# Patient Record
Sex: Male | Born: 1990 | Race: Black or African American | Hispanic: No | Marital: Single | Smoking: Never smoker
Health system: Southern US, Community
[De-identification: ages and names within clinical notes are randomized; demographics above are authoritative.]

## PROBLEM LIST (undated history)

## (undated) DIAGNOSIS — J40 Bronchitis, not specified as acute or chronic: Secondary | ICD-10-CM

## (undated) HISTORY — PX: OTHER SURGICAL HISTORY: SHX169

---
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
## 2010-07-21 ENCOUNTER — Emergency Department (HOSPITAL_COMMUNITY)
Admission: EM | Admit: 2010-07-21 | Discharge: 2010-07-21 | Payer: Self-pay | Source: Home / Self Care | Admitting: Emergency Medicine

## 2011-01-03 ENCOUNTER — Emergency Department (HOSPITAL_COMMUNITY)
Admission: EM | Admit: 2011-01-03 | Discharge: 2011-01-04 | Disposition: A | Discharge status: Home / Self Care | Payer: Self-pay | Attending: Emergency Medicine | Admitting: Emergency Medicine

## 2011-01-03 ENCOUNTER — Emergency Department (HOSPITAL_COMMUNITY): Payer: Self-pay

## 2011-01-03 DIAGNOSIS — N453 Epididymo-orchitis: Secondary | ICD-10-CM | POA: Insufficient documentation

## 2011-01-03 DIAGNOSIS — N509 Disorder of male genital organs, unspecified: Secondary | ICD-10-CM | POA: Insufficient documentation

## 2011-01-03 LAB — URINALYSIS, ROUTINE W REFLEX MICROSCOPIC
Glucose, UA: NEGATIVE mg/dL
Nitrite: NEGATIVE
Protein, ur: 30 mg/dL — AB
Specific Gravity, Urine: 1.022 (ref 1.005–1.030)
Urobilinogen, UA: 1 mg/dL (ref 0.0–1.0)
pH: 6 (ref 5.0–8.0)

## 2011-01-03 LAB — URINE MICROSCOPIC-ADD ON

## 2011-01-05 LAB — URINE CULTURE
Colony Count: NO GROWTH
Culture  Setup Time: 201204221125
Culture: NO GROWTH

## 2011-01-06 LAB — GC/CHLAMYDIA PROBE AMP, URINE
Chlamydia, Swab/Urine, PCR: POSITIVE — AB
GC Probe Amp, Urine: POSITIVE — AB

## 2011-01-30 NOTE — Op Note (Signed)
NAMESHUAYB, SCHEPERS              ACCOUNT NO.:  [id number]   MEDICAL RECORD NO.:  [id number]          PATIENT TYPE:  INP   LOCATION:  6154                         FACILITY:  [facility]   PHYSICIAN:  Leonia Corona, M.D.  DATE OF BIRTH:  27-Feb-1991   DATE OF PROCEDURE:  06/18/2005  DATE OF DISCHARGE:                                 OPERATIVE REPORT   PREOPERATIVE DIAGNOSES:  1.  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  2.  Need for long term intravenous access.   POSTOPERATIVE DIAGNOSES:  1.  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  2.  Need for long term intravenous access.   OPERATION PERFORMED:  Placement of Hickman catheter.   SURGEON:  Leonia Corona, M.D.   ASSISTANT:  Janetta Hora. Fields, MD   ANESTHESIA:  General endotracheal tube.   INDICATIONS FOR PROCEDURE:  This 20 year old male child was evaluated for  severe abdominal pain and vomiting.  CT scan was suggested for gastric  outlet obstruction secondary to duodenal trauma causing big duodenal  hematoma.  Hence the indication for the procedure.   DESCRIPTION OF PROCEDURE:  The patient was brought to the operating room and  placed supine on the operating table.  General endotracheal tube anesthesia  was given.  The right side of the neck and right chest, shoulder, and right  upper arm was cleaned, prepped and draped in usual manner.  We planned to  place a Hickman catheter 7 [place] double lumen by external jugular vein cut  down.  The patient was given Trendelenburg position to fill the external  jugular vein.  A very superficial incision over the external jugular vein  was made approximately 3 cm above the clavicle.  A 1 cm segment of the  jugular vein was exposed.  Two 4-0 silk sutures were placed just beneath the  selected segment of the external jugular vein.  A counterincision was made  just to the lateral side of the right nipple for the exit of the catheter.  A small incision  was made with knife and a subcutaneous pocket was created  for the placement of the cuff of the catheter.  Blunt tip malleable probe  was passed from this incision and the tip was delivered through the neck  incision.  The Hickman 7 [place] double lumen catheter was fed through the  eye of the probe and pulled through the subcutaneous tunnel and tip was  delivered into the neck incision.  At this point appropriate length under  fluoroscopic control was cut so that the tip of the catheter would lie in  the superior vena cava.  The catheter was cut in oblique fashion.  A  venotomy was made in the exposed part of the jugular vein and the catheter  was spread through this venotomy and advanced under fluoroscopic control.  We were able to follow the tip of the catheter; however, the catheter did  not advance in the direction of the superior vena cava.  Instead it turned  curled upwards towards the neck.  Several attempts were made which were not  successful and at  this point 0.035 size Glide wire used to feed through the  catheter and advance it to guide the catheter in the right direction;  however, this attempt also failed.  Requested vascular surgeon Dr. Darrick Penna  for assistance, who joined me and made and attempt to use a very small Glide  wire with a flexible tip; however, this guidewire also could not be fed  through the catheter to guide it in the right direction toward the superior  vena cava.  Having failed several multiple attempts to guide the tip of the  catheter to the superior vena cava under fluoroscopic control, we decided to  ligate this external jugular vein and make a fresh access in the internal  jugular vein percutaneously.  A long needle attached to a syringe was then  used to access the right internal jugular vein.  Once the access was  obtained, the guidewire was passed through the needle and the position of  the tip of the catheter and guidewire was confirmed under  fluoroscopic  control.  At this point a small incision was made at the site of the needle.  A tunnel was created with the malleable probe from the original neck  incision for external jugular vein towards the site of entry into the  internal jugular vein.  The Hickman catheter was fed through the eye of this  probe and tip was now delivered through the incision at the site of entry  into the internal jugular vein.  The needle was withdrawn keeping the wire  inside the vein.  A peel away catheter with the dilator was introduced over  the guidewire and the tract was dilated and the dilator was removed and the  peel away catheter was held in position and the Hickman catheter was fed  through the peel away catheter confirming the correct position on  fluoroscopy.  The peel away catheter was peeled away and removed.  The tip  of the catheter lay in the innominate vein close to its junction with the  superior vena cava.  The position was confirmed under fluoroscopic control.  The catheter was flushed easily with normal saline and returned venous blood  without any resistance.  At this point the neck incision was closed using 4-  0 Vicryl in subcuticular fashion.  The second incision at the site of  external jugular vein was also closed using 4-0 Vicryl in subcuticular  fashion.  These incisions were cleaned and dried and Steri-Strip dressing  was applied.  The exit site incision on the chest wall was closed using 4-0  Tycron which was tied around  the catheter to prevent external pullout of  the catheter.  The catheter was once again flushed with normal saline and  returned blood easily and subsequently it was hep-locked using 1.2 mL of 100  units per mL heparinized saline solution.  The Steri-Strips were applied at  the exit site of the catheter and then the catheter was coiled and covered  with Tegaderm dressing.  The patient tolerated the procedure well which was smooth and uneventful.   Patient was later extubated and transported to  recovery room in good and stable condition.      Leonia Corona, M.D.  Electronically Signed     SF/MEDQ  D:  06/18/2005  T:  06/19/2005  Job:  [id number]   cc:   Janetta Hora. Fields, MD  [address], [place] [address]
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
## 2011-07-10 IMAGING — US US ART/VEN ABD/PELV/SCROTUM DOPPLER COMPLETE
1 series · 13 of 25 positions shown · non-contrast
Comparison: None.

CLINICAL DATA: Right testicular pain and swelling.

SCROTAL ULTRASOUND
DOPPLER ULTRASOUND OF THE TESTICLES
TECHNIQUE: Complete ultrasound examination of the testicles,
epididymis, and other scrotal structures was performed.  Color and
spectral Doppler ultrasound were also utilized to evaluate blood
flow to the testicles.

[Series 1: us art/ven abd/pelv/scrotum doppler complete · 0.06mm/px · 13 of 52 slices shown]
[im 1/52]
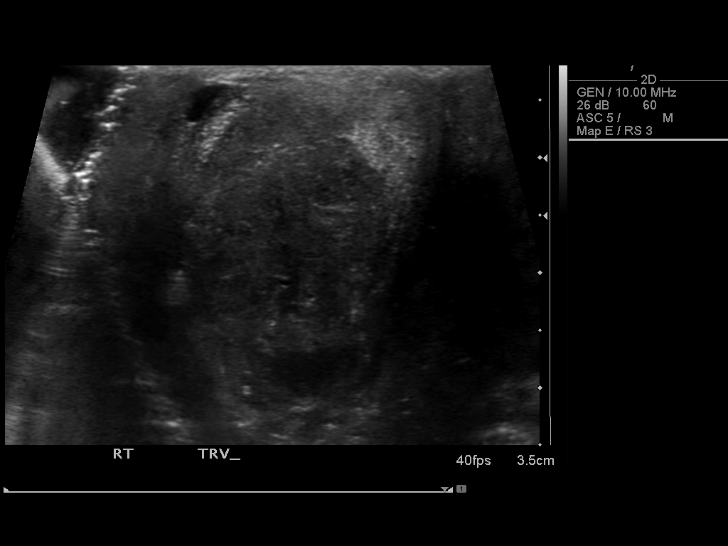
[im 5/52]
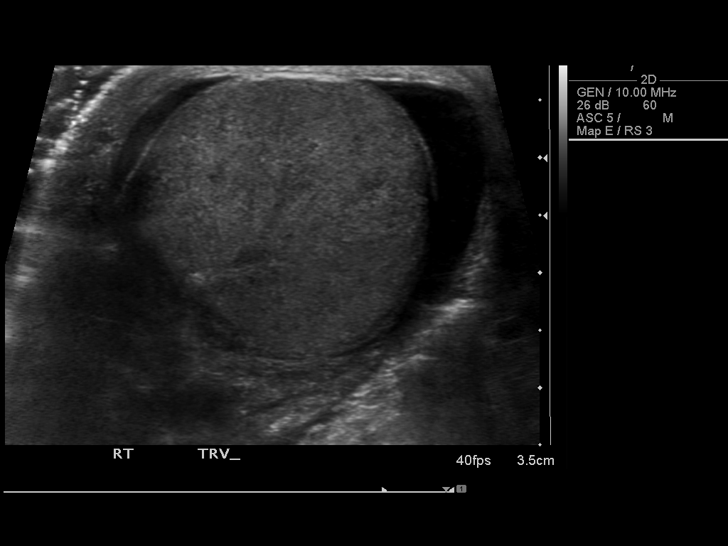
[im 9/52]
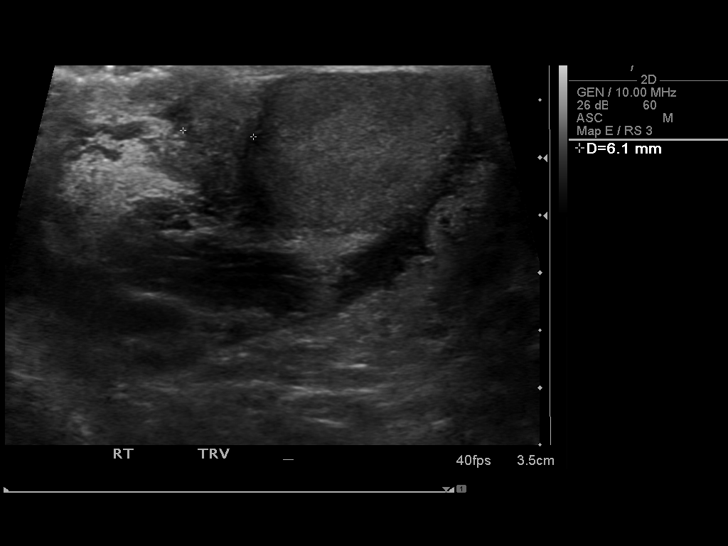
[im 13/52]
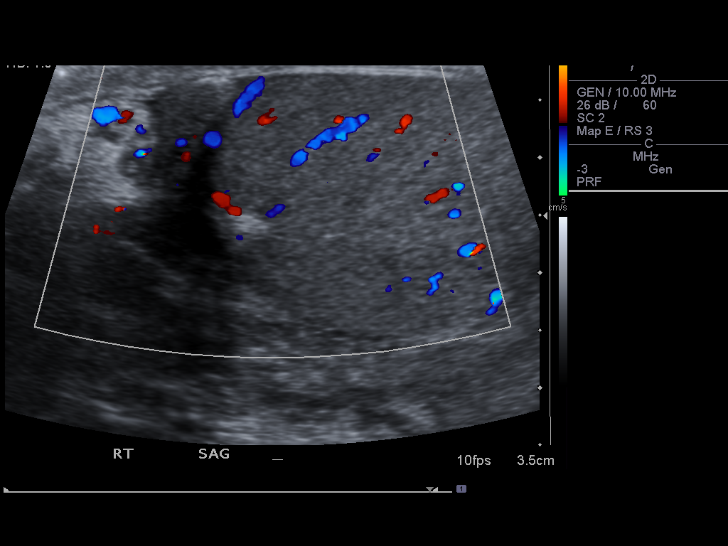
[im 18/52]
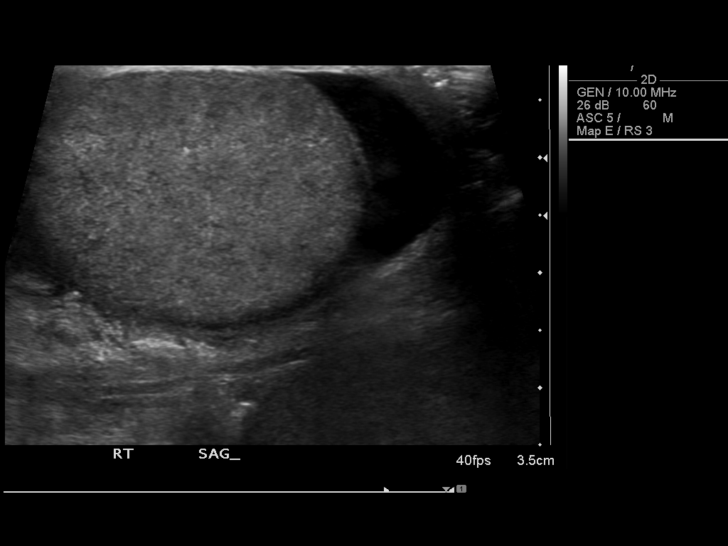
[im 22/52]
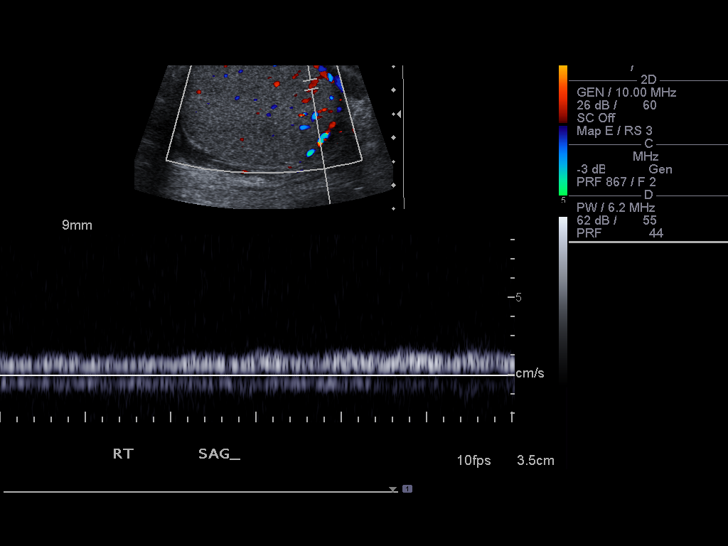
[im 26/52]
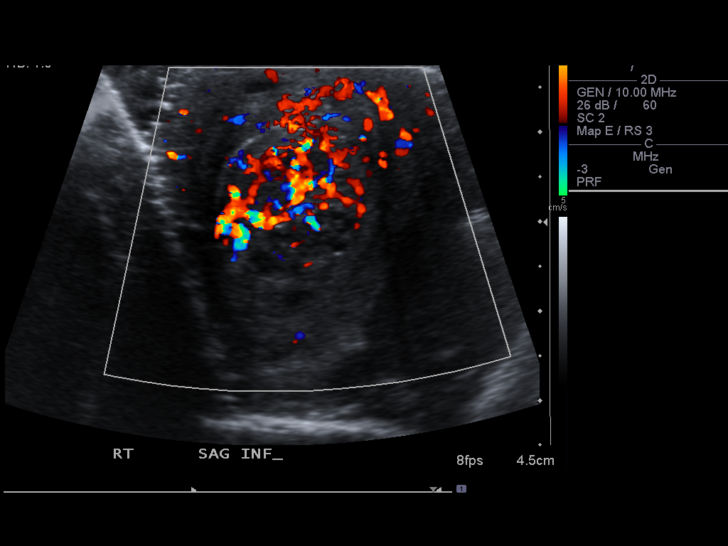
[im 30/52]
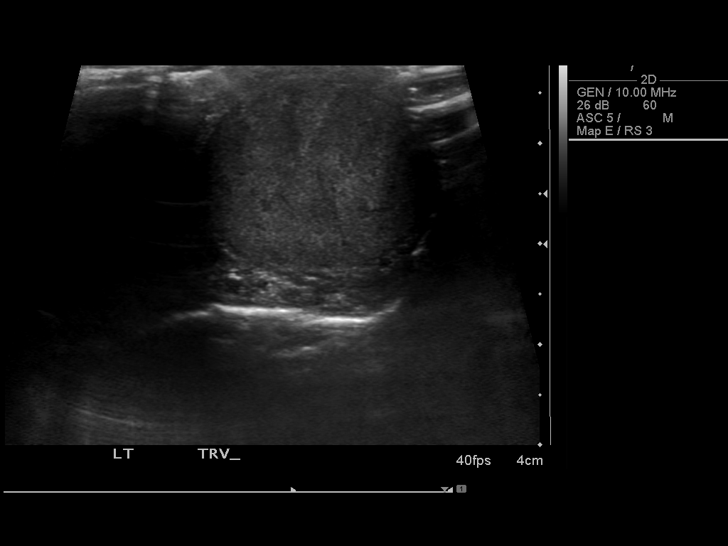
[im 35/52]
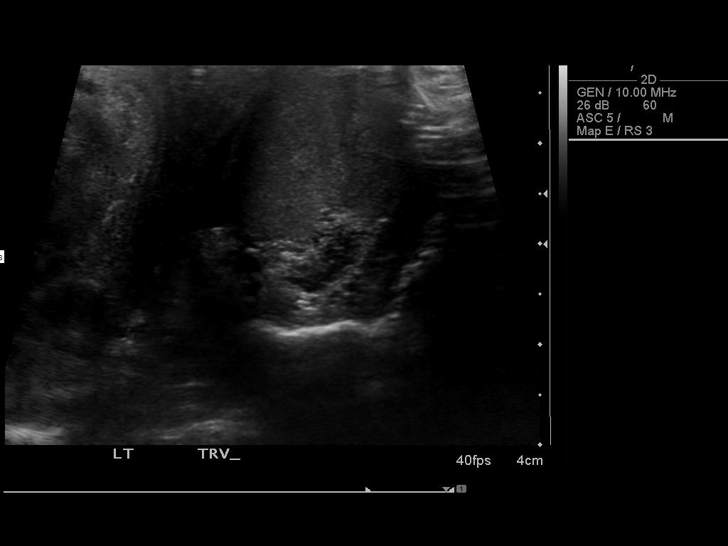
[im 39/52]
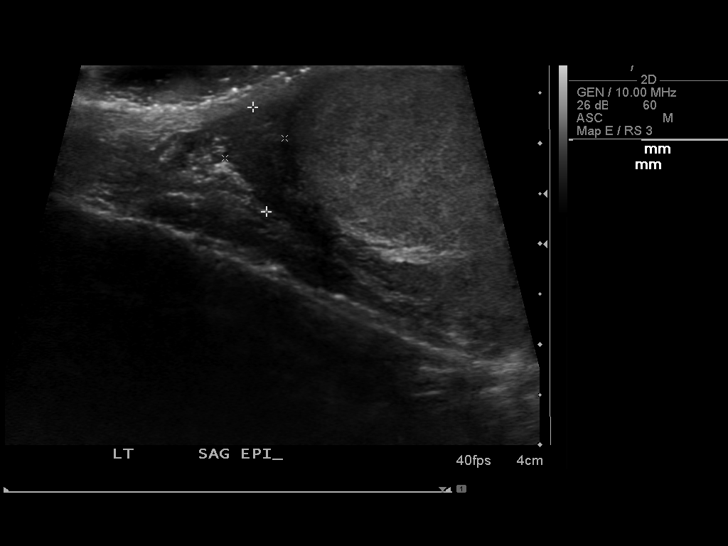
[im 43/52]
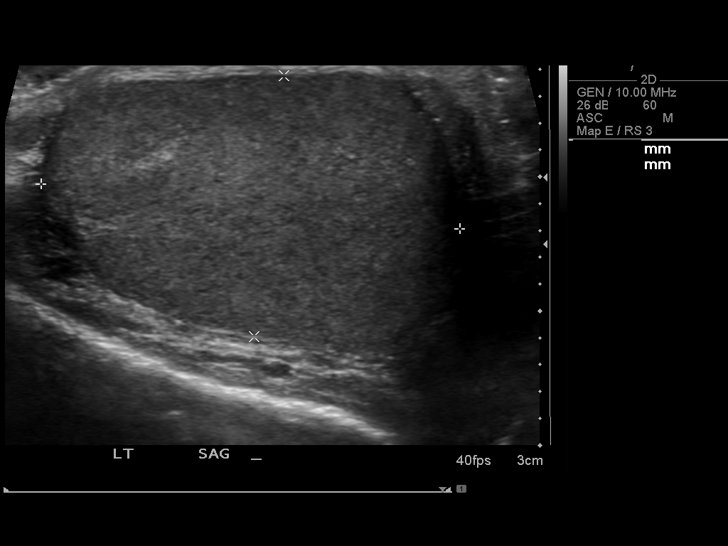
[im 47/52]
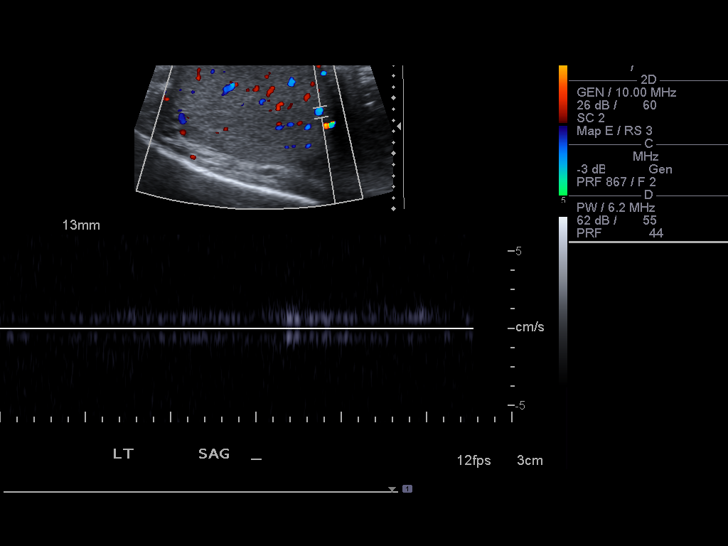
[im 52/52]
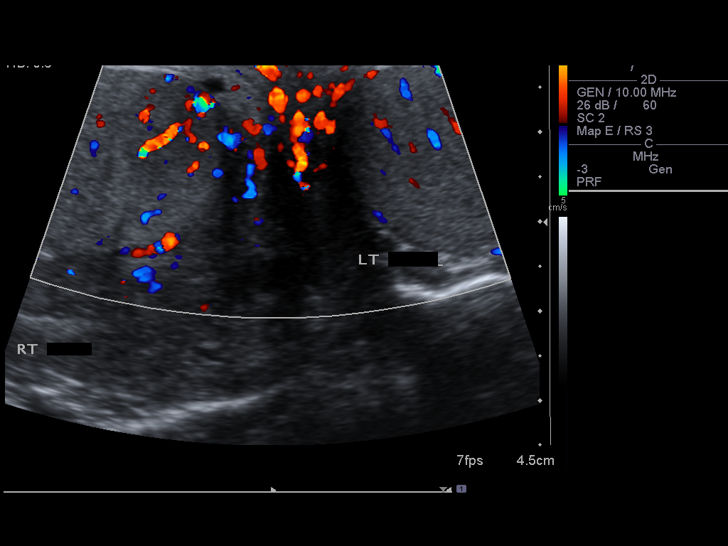

[13 of 25 positions shown; findings below may reference images not displayed]

FINDINGS: The right testis is mildly enlarged compared to the
left, with a mildly heterogeneous appearance.  The right testis
measures 3.6 x 2.5 x 2.8 cm, while the left testis measures 3.1 x
2.0 x 2.3 cm.  No testicular masses are seen, and there is no
evidence of microlithiasis.  Both epididymal heads are unremarkable
in appearance.

A small right hydrocele is noted.  There is no evidence
ofvaricocele or other extra-testicular abnormality.

Blood flow is seen within both testicles on color Doppler
sonography.  Doppler spectral waveforms show both arterial and
venous flow signal in both testicles.  There is abnormally
increased blood flow within the right testis, and in the
surrounding soft tissues, compatible with hyperemia.
IMPRESSION: 1.  No evidence of testicular torsion.
2. Right testicular hyperemia and mild asymmetric enlargement,
likely reflecting mild right-sided orchitis; surrounding soft
tissue hyperemia noted.
3.  Small right-sided hydrocele seen.

Critical test results telephoned to Dr. Dorota Miroslaw Scheffler at the time

## 2015-01-15 ENCOUNTER — Emergency Department (HOSPITAL_COMMUNITY)
Admission: EM | Admit: 2015-01-15 | Discharge: 2015-01-15 | Disposition: A | Discharge status: Home / Self Care | Payer: Self-pay | Attending: Emergency Medicine | Admitting: Emergency Medicine

## 2015-01-15 ENCOUNTER — Encounter (HOSPITAL_COMMUNITY): Payer: Self-pay | Admitting: Emergency Medicine

## 2015-01-15 DIAGNOSIS — Y92410 Unspecified street and highway as the place of occurrence of the external cause: Secondary | ICD-10-CM | POA: Insufficient documentation

## 2015-01-15 DIAGNOSIS — R51 Headache: Secondary | ICD-10-CM

## 2015-01-15 DIAGNOSIS — R519 Headache, unspecified: Secondary | ICD-10-CM

## 2015-01-15 DIAGNOSIS — Y9389 Activity, other specified: Secondary | ICD-10-CM | POA: Insufficient documentation

## 2015-01-15 DIAGNOSIS — Z720 Tobacco use: Secondary | ICD-10-CM | POA: Insufficient documentation

## 2015-01-15 DIAGNOSIS — S0990XA Unspecified injury of head, initial encounter: Secondary | ICD-10-CM | POA: Insufficient documentation

## 2015-01-15 DIAGNOSIS — Y998 Other external cause status: Secondary | ICD-10-CM | POA: Insufficient documentation

## 2015-01-15 HISTORY — DX: Bronchitis, not specified as acute or chronic: J40

## 2015-01-15 MED ORDER — NAPROXEN 500 MG PO TABS: 500.0000 mg | ORAL_TABLET | Freq: Two times a day (BID) | ORAL | Status: AC
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
## 2016-11-17 ENCOUNTER — Emergency Department (HOSPITAL_BASED_OUTPATIENT_CLINIC_OR_DEPARTMENT_OTHER)
Admission: EM | Admit: 2016-11-17 | Discharge: 2016-11-17 | Disposition: A | Discharge status: Home / Self Care | Payer: Worker's Compensation | Attending: Emergency Medicine | Admitting: Emergency Medicine

## 2016-11-17 ENCOUNTER — Encounter (HOSPITAL_BASED_OUTPATIENT_CLINIC_OR_DEPARTMENT_OTHER): Payer: Self-pay | Admitting: Emergency Medicine

## 2016-11-17 ENCOUNTER — Emergency Department (HOSPITAL_BASED_OUTPATIENT_CLINIC_OR_DEPARTMENT_OTHER): Payer: Worker's Compensation

## 2016-11-17 DIAGNOSIS — Z23 Encounter for immunization: Secondary | ICD-10-CM | POA: Diagnosis not present

## 2016-11-17 DIAGNOSIS — S6992XA Unspecified injury of left wrist, hand and finger(s), initial encounter: Secondary | ICD-10-CM | POA: Diagnosis present

## 2016-11-17 DIAGNOSIS — S61313A Laceration without foreign body of left middle finger with damage to nail, initial encounter: Secondary | ICD-10-CM | POA: Diagnosis not present

## 2016-11-17 DIAGNOSIS — Y929 Unspecified place or not applicable: Secondary | ICD-10-CM | POA: Insufficient documentation

## 2016-11-17 DIAGNOSIS — Y9389 Activity, other specified: Secondary | ICD-10-CM | POA: Insufficient documentation

## 2016-11-17 DIAGNOSIS — F17200 Nicotine dependence, unspecified, uncomplicated: Secondary | ICD-10-CM | POA: Insufficient documentation

## 2016-11-17 DIAGNOSIS — W231XXA Caught, crushed, jammed, or pinched between stationary objects, initial encounter: Secondary | ICD-10-CM | POA: Insufficient documentation

## 2016-11-17 DIAGNOSIS — Y990 Civilian activity done for income or pay: Secondary | ICD-10-CM | POA: Insufficient documentation

## 2016-11-17 MED ORDER — HYDROCODONE-ACETAMINOPHEN 5-325 MG PO TABS
1.0000 | ORAL_TABLET | Freq: Once | ORAL | Status: AC
  Administered 2016-11-17: 1 via ORAL
  Filled 2016-11-17: qty 1

## 2016-11-17 MED ORDER — LIDOCAINE HCL (PF) 1 % IJ SOLN
5.0000 mL | Freq: Once | INTRAMUSCULAR | Status: AC
  Administered 2016-11-17: 5 mL
  Filled 2016-11-17: qty 5

## 2016-11-17 MED ORDER — HYDROCODONE-ACETAMINOPHEN 5-325 MG PO TABS: 2.0000 | ORAL_TABLET | ORAL | 0 refills | Status: AC | PRN

## 2016-11-17 MED ORDER — SULFAMETHOXAZOLE-TRIMETHOPRIM 800-160 MG PO TABS
1.0000 | ORAL_TABLET | Freq: Once | ORAL | Status: AC
  Administered 2016-11-17: 1 via ORAL
  Filled 2016-11-17: qty 1

## 2016-11-17 MED ORDER — SULFAMETHOXAZOLE-TRIMETHOPRIM 800-160 MG PO TABS: 1.0000 | ORAL_TABLET | Freq: Two times a day (BID) | ORAL | 0 refills | Status: AC

## 2016-11-17 MED ORDER — TETANUS-DIPHTH-ACELL PERTUSSIS 5-2.5-18.5 LF-MCG/0.5 IM SUSP
0.5000 mL | Freq: Once | INTRAMUSCULAR | Status: AC
  Administered 2016-11-17: 0.5 mL via INTRAMUSCULAR
  Filled 2016-11-17: qty 0.5

## 2016-11-17 NOTE — ED Notes (Signed)
ED Provider at bedside. 

## 2016-11-17 NOTE — ED Notes (Signed)
ED Provider at bedside treating finger.

## 2016-11-17 NOTE — ED Triage Notes (Signed)
Pt has injury to left middle finger from roller on machine at work.

## 2016-11-17 NOTE — Discharge Instructions (Signed)
Please call Dr. Merlyn LotKuzma office in the morning around 8 or 8:30 to see when your appointment is. States that he'll be around 10 or 10:30. Please not eat or drink anything after 2 to 3:00 this morning. Keep wound dressed. Make sure he has some mild driving to the surgical center tomorrow. Return to the ED if you aren't able to control the bleeding tonight.

## 2016-11-17 NOTE — ED Provider Notes (Signed)
[facility]   CSN: [id number] Arrival date & time: 11/17/16  2029  By signing my name below, I, Cynda Acres, attest that this documentation has been prepared under the direction and in the presence of Demetrios Loll, PA-C Electronically Signed: Cynda Acres, Scribe. 11/17/16. 10:03 PM.  History   Chief Complaint Chief Complaint  Patient presents with  . Finger Injury    HPI Comments: Benjamin Hancock is a 26 y.o. male with no apparent medical history, who presents to the Emergency Department complaining of a sudden-onset, constant left middle finger pain s/p injury that happened earlier today. Patient states this injury happened while at work. Patient states his finger was pinched in between a shaft and a roller machine. Patient reports pulling the finger out from underneath the roller machine. Patient has associated bleeding. Patient reports applying pressure with gauze in order to control the bleeding. Patient is unsure of when his last tetanus shot was. Patient denies any problems with range of motion, nausea, vomiting, Weakness, paresthesias or any other symptoms.   The history is provided by the patient. No language interpreter was used.    Past Medical History:  Diagnosis Date  . Bronchitis     There are no active problems to display for this patient.   History reviewed. No pertinent surgical history.     Home Medications    Prior to Admission medications   Medication Sig Start Date End Date Taking? Authorizing Provider  naproxen (NAPROSYN) 500 MG tablet Take 1 tablet (500 mg total) by mouth 2 (two) times daily. 01/15/15   Tatyana Kirichenko, PA-C  naproxen sodium (ANAPROX) 220 MG tablet Take 220 mg by mouth 2 (two) times daily with a meal.    Historical Provider, MD    Family History Family History  Problem Relation Age of Onset  . Diabetes Mother     Social History Social History  Substance Use Topics  . Smoking status: Current  Some Day Smoker  . Smokeless tobacco: Never Used  . Alcohol use Yes     Comment: occ     Allergies   Patient has no known allergies.   Review of Systems Review of Systems  Constitutional: Negative for chills and fever.  Gastrointestinal: Negative for nausea and vomiting.  Musculoskeletal: Negative for joint swelling.  Skin: Positive for wound (left middle phalnx).       Bleeding of the left middle phalanx.  All other systems reviewed and are negative.    Physical Exam Updated Vital Signs BP 134/90 (BP Location: Right Arm)   Pulse 65   Temp 98.7 F (37.1 C) (Oral)   Resp 16   Ht 5\' 7"  (1.702 m)   Wt 59.9 kg   SpO2 100%   BMI 20.67 kg/m   Physical Exam  Constitutional: He is oriented to person, place, and time. He appears well-developed and well-nourished. No distress.  HENT:  Head: Normocephalic and atraumatic.  Mouth/Throat: Oropharynx is clear and moist.  Eyes: Conjunctivae and EOM are normal. Pupils are equal, round, and reactive to light.  Neck: Normal range of motion. Neck supple.  Cardiovascular: Normal rate and intact distal pulses.   Pulmonary/Chest: Effort normal.  Abdominal: Soft. Bowel sounds are normal.  Musculoskeletal: Normal range of motion.  Patient with wound to the left middle finger. Degloving wound noted. No exposed bone. The tissue surrounding the bone. Slight bleeding after bandage was removed. Nail is intact. Full range of motion with PIP and DIP with flexion and  extension. Sensation intact. Mild tenderness to palpation. Radial pulses are 2+ bilaterally.  Neurological: He is alert and oriented to person, place, and time.  Skin: Skin is warm and dry. Capillary refill takes less than 2 seconds.  Psychiatric: He has a normal mood and affect.  Nursing note and vitals reviewed.        ED Treatments / Results  DIAGNOSTIC STUDIES: Oxygen Saturation is 100% on RA, normal by my interpretation.    COORDINATION OF CARE: 10:03 PM Discussed  treatment plan with pt at bedside and pt agreed to plan, which includes a hand surgeon consult and pain medication.   Labs (all labs ordered are listed, but only abnormal results are displayed) Labs Reviewed - No data to display  EKG  EKG Interpretation None       Radiology Dg Finger Middle Left  Result Date: 11/17/2016 CLINICAL DATA:  Smashed tip of finger EXAM: LEFT MIDDLE FINGER 2+V COMPARISON:  None. FINDINGS: Soft tissue injury distal portion of the third digit. There is they fracture involving the tuft of the distal phalanx. Tiny displaced bone fragment or possible small radiopaque foreign body along the volar soft tissues. IMPRESSION: 1. Tuft fracture of the third distal phalanx 2. Faint radiopacity projecting over the volar soft tissues at the finger tip which may represent a small foreign body or displaced bone fragment Electronically Signed   By: Jasmine Pang M.D.   On: 11/17/2016 21:26    Procedures .Nerve Block Date/Time: 11/18/2016 1:57 AM Performed by: Demetrios Loll T Authorized by: Demetrios Loll T   Consent:    Consent obtained:  Verbal   Consent given by:  Patient   Risks discussed:  Allergic reaction, bleeding, infection, nerve damage, pain, intravenous injection, unsuccessful block and swelling   Alternatives discussed:  No treatment Indications:    Indications:  Pain relief Location:    Body area:  Upper extremity   Upper extremity nerve:  Metacarpal   Laterality:  Left Pre-procedure details:    Skin preparation:  Povidone-iodine   Preparation: Patient was prepped and draped in usual sterile fashion   Skin anesthesia (see MAR for exact dosages):    Skin anesthesia method:  None Procedure details (see MAR for exact dosages):    Block needle gauge:  27 G   Anesthetic injected:  Lidocaine 1% w/o epi   Injection procedure:  Anatomic landmarks identified, anatomic landmarks palpated, incremental injection, introduced needle and negative aspiration for  blood   Paresthesia:  Immediately resolved Post-procedure details:    Dressing:  None   Patient tolerance of procedure:  Tolerated well, no immediate complications   (including critical care time)  Medications Ordered in ED Medications  lidocaine (PF) (XYLOCAINE) 1 % injection 5 mL (5 mLs Infiltration Given 11/17/16 2243)  Tdap (BOOSTRIX) injection 0.5 mL (0.5 mLs Intramuscular Given 11/17/16 2245)  sulfamethoxazole-trimethoprim (BACTRIM DS,SEPTRA DS) 800-160 MG per tablet 1 tablet (1 tablet Oral Given 11/17/16 2244)  HYDROcodone-acetaminophen (NORCO/VICODIN) 5-325 MG per tablet 1 tablet (1 tablet Oral Given 11/17/16 2244)     Initial Impression / Assessment and Plan / ED Course  I have reviewed the triage vital signs and the nursing notes.  Pertinent labs & imaging results that were available during my care of the patient were reviewed by me and considered in my medical decision making (see chart for details).     Patient resents to the ED with wound to his left middle finger after pinching it and a press roller at work prior  to arrival. Bleeding was controlled with apply bandage. X-ray was performed that showed tuft fracture of the third distal phalanx with possible displaced bone fragment over the volar soft tissues of the fingertip. Patient is neurovascularly intact. Strength is normal. Full range of motion. There is a degloving wound to the tip of the finger with nailbed intact. No overlying tissue to suture. Discussed with Dr. Merlyn LotKuzma with hand surgery who reviewed x-rays and images. Feels that patient can have a pressure bandage applied and will see him in the office in the morning for surgery. Recommended nothing by mouth after 2 AM. Also recommend starting patient on prophylactic Bactrim. Patient was given Tdap. Wound was thoroughly cleaned and irrigated by myself. A digital block was performed. Wound was dressed with pressure dressing. No bleeding was noted. Patient remains hemodynamically  stable and neurovascularly intact. Have given all information to follow-up in office in the morning. Patient has no complaints prior to discharge.Pt is hemodynamically stable, in NAD, & able to ambulate in the ED. Pain has been managed & has no complaints prior to dc. Pt is comfortable with above plan and is stable for discharge at this time. All questions were answered prior to disposition. Strict return precautions for f/u to the ED were discussed.     Final Clinical Impressions(s) / ED Diagnoses   Final diagnoses:  Laceration of left middle finger with damage to nail, foreign body presence unspecified, initial encounter    New Prescriptions Discharge Medication List as of 11/17/2016 11:35 PM    START taking these medications   Details  HYDROcodone-acetaminophen (NORCO/VICODIN) 5-325 MG tablet Take 2 tablets by mouth every 4 (four) hours as needed., Starting Tue 11/17/2016, Print    sulfamethoxazole-trimethoprim (BACTRIM DS,SEPTRA DS) 800-160 MG tablet Take 1 tablet by mouth 2 (two) times daily., Starting Tue 11/17/2016, Print       I personally performed the services described in this documentation, which was scribed in my presence. The recorded information has been reviewed and is accurate.    Rise MuKenneth T Kolleen Ochsner, PA-C 11/18/16 0200    Tilden FossaElizabeth Rees, MD 11/20/16 [phone]

## 2017-05-24 IMAGING — DX DG FINGER MIDDLE 2+V*L*
3 series · 3 of 3 positions shown · non-contrast
Comparison: None.

CLINICAL DATA: Smashed tip of finger

EXAM:
LEFT MIDDLE FINGER 2+V

[finger ap]
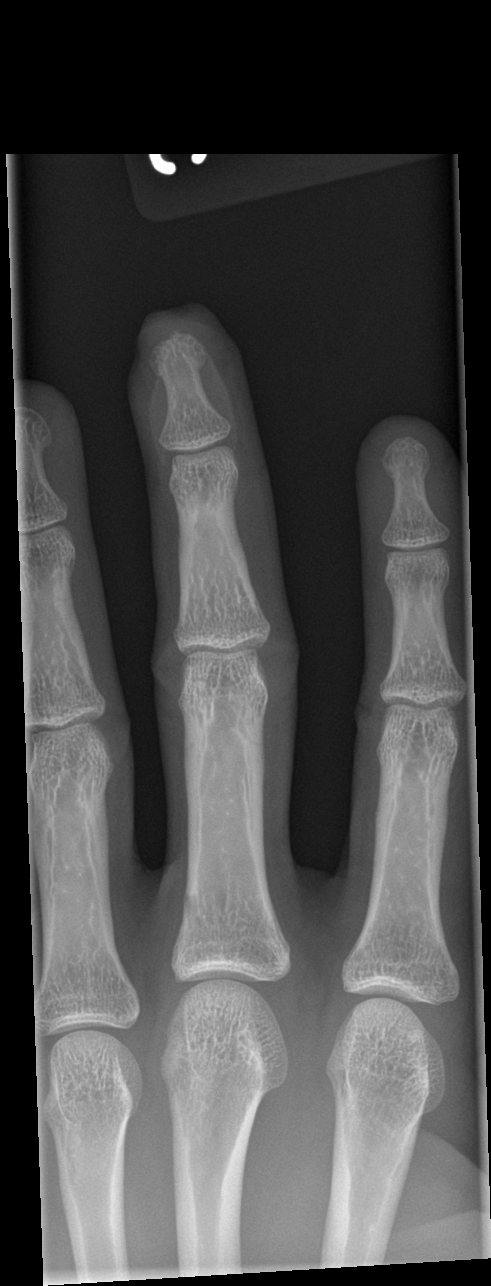

[finger obl]
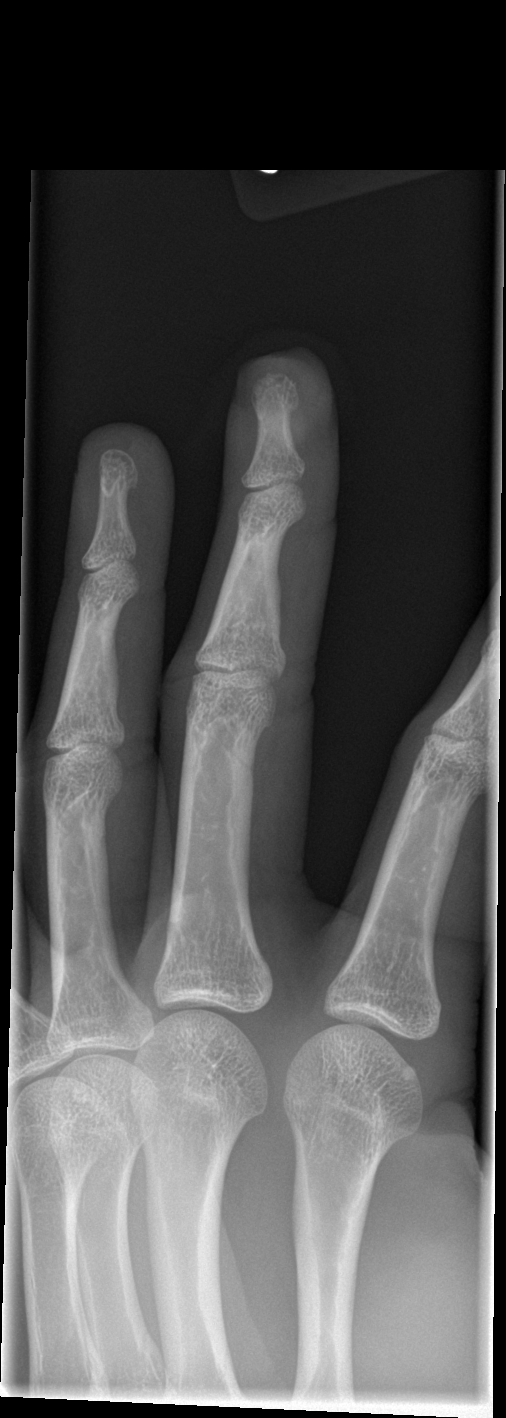

[finger lat]
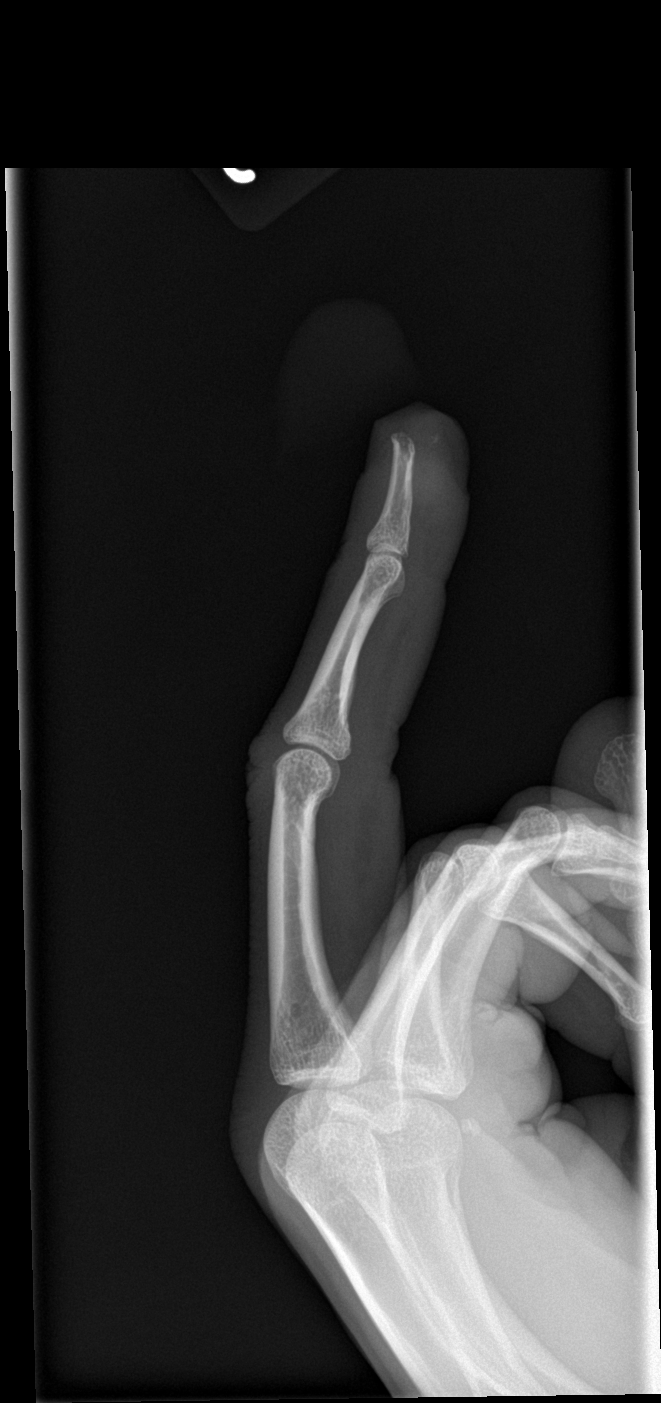

[3 of 3 positions shown; findings below may reference images not displayed]

FINDINGS: Soft tissue injury distal portion of the third digit. There is they
fracture involving the tuft of the distal phalanx. Tiny displaced
bone fragment or possible small radiopaque foreign body along the
volar soft tissues.
IMPRESSION: 1. Tuft fracture of the third distal phalanx
2. Faint radiopacity projecting over the volar soft tissues at the
finger tip which may represent a small foreign body or displaced
bone fragment

## 2018-02-24 ENCOUNTER — Ambulatory Visit (HOSPITAL_COMMUNITY)
Admission: EM | Admit: 2018-02-24 | Discharge: 2018-02-24 | Disposition: A | Discharge status: Home / Self Care | Payer: 59 | Attending: Family Medicine | Admitting: Family Medicine

## 2018-02-24 ENCOUNTER — Encounter (HOSPITAL_COMMUNITY): Payer: Self-pay | Admitting: Emergency Medicine

## 2018-02-24 ENCOUNTER — Other Ambulatory Visit: Payer: Self-pay

## 2018-02-24 DIAGNOSIS — R05 Cough: Secondary | ICD-10-CM

## 2018-02-24 DIAGNOSIS — R059 Cough, unspecified: Secondary | ICD-10-CM

## 2018-02-24 MED ORDER — ALBUTEROL SULFATE HFA 108 (90 BASE) MCG/ACT IN AERS: 2.0000 | INHALATION_SPRAY | RESPIRATORY_TRACT | 1 refills | Status: AC | PRN

## 2018-02-24 NOTE — ED Triage Notes (Signed)
Cough and congestion for several years.  No change recently.

## 2018-02-24 NOTE — ED Provider Notes (Addendum)
[facility]   [id number] 02/24/18 Arrival Time: 1349  ASSESSMENT & PLAN:  1. Cough    Meds ordered this encounter  Medications  . albuterol (PROVENTIL HFA;VENTOLIN HFA) 108 (90 Base) MCG/ACT inhaler    Sig: Inhale 2 puffs into the lungs every 4 (four) hours as needed for wheezing or shortness of breath.    Dispense:  1 Inhaler    Refill:  1   May f/u with PCP or here as needed. Questions role of silent reflux; discussed. Will try albuterol first.  Reviewed expectations re: course of current medical issues. Questions answered. Outlined signs and symptoms indicating need for more acute intervention. Patient verbalized understanding. After Visit Summary given.   SUBJECTIVE: History from: patient.  Benjamin Hancock is a 27 y.o. male who presents with complaint of on/off non-productive coughing for the past 'few years'. Intermittent bouts that may persisist for a few days. SOB: none. Wheezing: questions occasional when the cough is particularly bad. Fever: no. Overall normal PO intake without n/v. Sick contacts: no. OTC treatment: none. No h/o TB. No h/o acid reflux. No specific aggravating or alleviating factors reported.  Social History   Tobacco Use  Smoking Status Never Smoker  Smokeless Tobacco Never Used    ROS: As per HPI.   OBJECTIVE:  Vitals:   02/24/18 1415  BP: 125/86  Pulse: 60  Resp: 18  Temp: 97.9 F (36.6 C)  TempSrc: Oral  SpO2: 100%     General appearance: alert; appears fatigued HEENT: nasal congestion; clear runny nose; throat irritation secondary to post-nasal drainage Neck: supple without LAD Lungs: unlabored respirations, symmetrical air entry; cough: mild; no respiratory distress Skin: warm and dry Psychological: alert and cooperative; normal mood and affect   No Known Allergies  Past Medical History:  Diagnosis Date  . Bronchitis    Family History  Problem Relation Age of Onset  . Diabetes Mother    Social History     Socioeconomic History  . Marital status: Single    Spouse name: Not on file  . Number of children: Not on file  . Years of education: Not on file  . Highest education level: Not on file  Occupational History  . Not on file  Social Needs  . Financial resource strain: Not on file  . Food insecurity:    Worry: Not on file    Inability: Not on file  . Transportation needs:    Medical: Not on file    Non-medical: Not on file  Tobacco Use  . Smoking status: Never Smoker  . Smokeless tobacco: Never Used  Substance and Sexual Activity  . Alcohol use: Yes    Comment: occ  . Drug use: Yes    Types: Marijuana  . Sexual activity: Not on file  Lifestyle  . Physical activity:    Days per week: Not on file    Minutes per session: Not on file  . Stress: Not on file  Relationships  . Social connections:    Talks on phone: Not on file    Gets together: Not on file    Attends religious service: Not on file    Active member of club or organization: Not on file    Attends meetings of clubs or organizations: Not on file    Relationship status: Not on file  . Intimate partner violence:    Fear of current or ex partner: Not on file    Emotionally abused: Not on file  Physically abused: Not on file    Forced sexual activity: Not on file  Other Topics Concern  . Not on file  Social History Narrative  . Not on file           Mardella Layman, MD 03/23/18 8119    Mardella Layman, MD 03/23/18 [phone]
